# Patient Record
Sex: Male | Born: 1975 | Race: Black or African American | Hispanic: No | Marital: Single | Smoking: Never smoker
Health system: Southern US, Community
[De-identification: ages and names within clinical notes are randomized; demographics above are authoritative.]

## PROBLEM LIST (undated history)

## (undated) HISTORY — PX: HERNIA REPAIR: SHX51

---

## 2017-07-24 ENCOUNTER — Encounter (HOSPITAL_BASED_OUTPATIENT_CLINIC_OR_DEPARTMENT_OTHER): Payer: Self-pay | Admitting: *Deleted

## 2017-07-24 ENCOUNTER — Other Ambulatory Visit: Payer: Self-pay

## 2017-07-24 ENCOUNTER — Emergency Department (HOSPITAL_BASED_OUTPATIENT_CLINIC_OR_DEPARTMENT_OTHER): Payer: BLUE CROSS/BLUE SHIELD

## 2017-07-24 ENCOUNTER — Emergency Department (HOSPITAL_BASED_OUTPATIENT_CLINIC_OR_DEPARTMENT_OTHER)
Admission: EM | Admit: 2017-07-24 | Discharge: 2017-07-24 | Disposition: A | Discharge status: Home / Self Care | Payer: BLUE CROSS/BLUE SHIELD | Attending: Emergency Medicine | Admitting: Emergency Medicine

## 2017-07-24 DIAGNOSIS — R519 Headache, unspecified: Secondary | ICD-10-CM

## 2017-07-24 DIAGNOSIS — F439 Reaction to severe stress, unspecified: Secondary | ICD-10-CM

## 2017-07-24 DIAGNOSIS — F419 Anxiety disorder, unspecified: Secondary | ICD-10-CM | POA: Diagnosis not present

## 2017-07-24 DIAGNOSIS — R51 Headache: Secondary | ICD-10-CM | POA: Diagnosis present

## 2017-07-24 DIAGNOSIS — R52 Pain, unspecified: Secondary | ICD-10-CM

## 2017-07-24 DIAGNOSIS — R079 Chest pain, unspecified: Secondary | ICD-10-CM | POA: Diagnosis not present

## 2017-07-24 DIAGNOSIS — M791 Myalgia, unspecified site: Secondary | ICD-10-CM | POA: Diagnosis not present

## 2017-07-24 DIAGNOSIS — R748 Abnormal levels of other serum enzymes: Secondary | ICD-10-CM | POA: Diagnosis not present

## 2017-07-24 LAB — COMPREHENSIVE METABOLIC PANEL
ALK PHOS: 45 U/L (ref 38–126)
ALT: 27 U/L (ref 17–63)
AST: 40 U/L (ref 15–41)
Albumin: 4.5 g/dL (ref 3.5–5.0)
Anion gap: 9 (ref 5–15)
BUN: 16 mg/dL (ref 6–20)
CALCIUM: 9.5 mg/dL (ref 8.9–10.3)
CHLORIDE: 105 mmol/L (ref 101–111)
CO2: 23 mmol/L (ref 22–32)
CREATININE: 1.14 mg/dL (ref 0.61–1.24)
GFR calc Af Amer: >60 mL/min (ref >60)
GFR calc non Af Amer: >60 mL/min (ref >60)
Glucose, Bld: 81 mg/dL (ref 65–99)
Potassium: 3.5 mmol/L (ref 3.5–5.1)
SODIUM: 137 mmol/L (ref 135–145)
Total Bilirubin: 1.8 mg/dL — ABNORMAL HIGH (ref 0.3–1.2)
Total Protein: 7.9 g/dL (ref 6.5–8.1)

## 2017-07-24 LAB — URINALYSIS, MICROSCOPIC (REFLEX)

## 2017-07-24 LAB — TROPONIN I: Troponin I: <0.03 ng/mL (ref <0.03)

## 2017-07-24 LAB — URINALYSIS, ROUTINE W REFLEX MICROSCOPIC
Bilirubin Urine: NEGATIVE
GLUCOSE, UA: NEGATIVE mg/dL
Ketones, ur: 15 mg/dL — AB
LEUKOCYTES UA: NEGATIVE
Nitrite: NEGATIVE
PH: 6 (ref 5.0–8.0)
Protein, ur: NEGATIVE mg/dL

## 2017-07-24 LAB — CBC WITH DIFFERENTIAL/PLATELET
Basophils Absolute: 0 10*3/uL (ref 0.0–0.1)
Basophils Relative: 0 %
EOS ABS: 0 10*3/uL (ref 0.0–0.7)
EOS PCT: 1 %
HCT: 40.8 % (ref 39.0–52.0)
HEMOGLOBIN: 14.7 g/dL (ref 13.0–17.0)
LYMPHS ABS: 2 10*3/uL (ref 0.7–4.0)
LYMPHS PCT: 50 %
MCH: 29.3 pg (ref 26.0–34.0)
MCHC: 36 g/dL (ref 30.0–36.0)
MCV: 81.3 fL (ref 78.0–100.0)
Monocytes Absolute: 0.3 10*3/uL (ref 0.1–1.0)
Monocytes Relative: 8 %
NEUTROS PCT: 41 %
Neutro Abs: 1.7 10*3/uL (ref 1.7–7.7)
PLATELETS: 146 10*3/uL — AB (ref 150–400)
RBC: 5.02 MIL/uL (ref 4.22–5.81)
RDW: 13.1 % (ref 11.5–15.5)
WBC: 4 10*3/uL (ref 4.0–10.5)

## 2017-07-24 LAB — CK
CK TOTAL: 614 U/L — AB (ref 49–397)
Total CK: 648 U/L — ABNORMAL HIGH (ref 49–397)

## 2017-07-24 MED ORDER — SODIUM CHLORIDE 0.9 % IV BOLUS
1000.0000 mL | Freq: Once | INTRAVENOUS | Status: AC
  Administered 2017-07-24: 1000 mL via INTRAVENOUS

## 2017-07-24 MED ORDER — KETOROLAC TROMETHAMINE 30 MG/ML IJ SOLN
30.0000 mg | Freq: Once | INTRAMUSCULAR | Status: AC
Start: 2017-07-24 — End: 2017-07-24
  Administered 2017-07-24: 30 mg via INTRAVENOUS
  Filled 2017-07-24: qty 1

## 2017-07-24 MED ORDER — PROCHLORPERAZINE EDISYLATE 10 MG/2ML IJ SOLN
10.0000 mg | Freq: Once | INTRAMUSCULAR | Status: AC
  Administered 2017-07-24: 10 mg via INTRAVENOUS
  Filled 2017-07-24: qty 2

## 2017-07-24 MED ORDER — DIPHENHYDRAMINE HCL 50 MG/ML IJ SOLN
25.0000 mg | Freq: Once | INTRAMUSCULAR | Status: AC
Start: 2017-07-24 — End: 2017-07-24
  Administered 2017-07-24: 25 mg via INTRAVENOUS
  Filled 2017-07-24: qty 1

## 2017-07-24 NOTE — ED Notes (Signed)
Patient transported to X-ray 

## 2017-07-24 NOTE — ED Notes (Signed)
Updated pt to plan of care 

## 2017-07-24 NOTE — ED Provider Notes (Signed)
[facility] EMERGENCY DEPARTMENT Provider Note   CSN: [id number] Arrival date & time: 07/24/17  1405     History   Chief Complaint Chief Complaint  Patient presents with  . Headache    HPI Jonathan Galvan is a 42 y.o. male.  HPI  1.6631mo migraine Pressure in head, has had body aches Anxiety Today began to have chest pain Lights make headache worse.  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 Today has been the worse.  Nothing makes it worse, happens spontaneously. Not exertional.  No n/v/dyspnea.  No fevers or cough, no leg pain or swelling.   Does not have PCP Saw urologist 2-3 years ago, difficulty urinating  No smoking, no drinking, no drugs Not known if family hx of CAD  History reviewed. No pertinent past medical history.  There are no active problems to display for this patient.   Past Surgical History:  Procedure Laterality Date  . HERNIA REPAIR          Home Medications    Prior to Admission medications   Not on File    Family History History reviewed. No pertinent family history.  Social History Social History   Tobacco Use  . Smoking status: Never Smoker  . Smokeless tobacco: Never Used  Substance Use Topics  . Alcohol use: Never    Frequency: Never  . Drug use: Never     Allergies   Quinine derivatives   Review of Systems Review of Systems  Constitutional: Positive for  fatigue. Negative for fever.  HENT: Negative for congestion and sore throat.   Eyes: Positive for photophobia. Negative for visual disturbance.  Respiratory: Negative for cough and shortness of breath.   Cardiovascular: Positive for chest pain. Negative for leg swelling.  Gastrointestinal: Negative for abdominal pain, constipation, diarrhea, nausea and vomiting.  Genitourinary: Negative for difficulty urinating.  Musculoskeletal: Positive for arthralgias and myalgias. Negative for back pain and neck stiffness.  Skin: Negative for rash.  Neurological: Positive for headaches. Negative for syncope, facial asymmetry, speech difficulty, weakness and numbness.  Psychiatric/Behavioral: Positive for sleep disturbance. Negative for suicidal ideas. The patient is nervous/anxious.      Physical Exam Updated Vital Signs BP 124/83   Pulse 76   Temp 98.5 F (36.9 C)   Resp 14   Ht 5\' 7"  (1.702 m)   Wt 77.1 kg (170 lb)   SpO2 100%   BMI 26.63 kg/m   Physical Exam  Constitutional: He is oriented to person, place, and time. He appears well-developed and well-nourished. No distress.  HENT:  Head: Normocephalic and atraumatic.  Eyes: Conjunctivae and EOM are normal.  Neck: Normal range of motion.  Cardiovascular: Normal rate, regular rhythm, normal heart sounds and intact distal pulses. Exam reveals no gallop and no friction rub.  No murmur heard. Pulmonary/Chest: Effort normal and breath sounds normal. No respiratory distress. He has no wheezes. He has no rales.  Abdominal: Soft. He  exhibits no distension. There is no tenderness. There is no guarding.  Musculoskeletal: He exhibits no edema.  Neurological: He is alert and oriented to person, place, and time. He has normal strength. No cranial nerve deficit or sensory deficit. Coordination normal. GCS eye subscore is 4. GCS verbal subscore is 5. GCS motor subscore is 6.  Skin: Skin is warm and dry. He is not diaphoretic.  Nursing note and vitals  reviewed.    ED Treatments / Results  Labs (all labs ordered are listed, but only abnormal results are displayed) Labs Reviewed  CBC WITH DIFFERENTIAL/PLATELET - Abnormal; Notable for the following components:      Result Value   Platelets 146 (*)    All other components within normal limits  COMPREHENSIVE METABOLIC PANEL - Abnormal; Notable for the following components:   Total Bilirubin 1.8 (*)    All other components within normal limits  CK - Abnormal; Notable for the following components:   Total CK 648 (*)    All other components within normal limits  URINALYSIS, ROUTINE W REFLEX MICROSCOPIC - Abnormal; Notable for the following components:   Specific Gravity, Urine >1.030 (*)    Hgb urine dipstick SMALL (*)    Ketones, ur 15 (*)    All other components within normal limits  URINALYSIS, MICROSCOPIC (REFLEX) - Abnormal; Notable for the following components:   Bacteria, UA RARE (*)    All other components within normal limits  CK - Abnormal; Notable for the following components:   Total CK 614 (*)    All other components within normal limits  TROPONIN I  TROPONIN I    EKG EKG Interpretation  Date/Time:  Wednesday July 24 2017 18:11:37 EDT Ventricular Rate:  67 PR Interval:    QRS Duration: 94 QT Interval:  393 QTC Calculation: 415 R Axis:   87 Text Interpretation:  Sinus rhythm Nonspecific T abnormalities, anterior leads No significant change since last tracing Confirmed by Alvira Monday ([id number]) on 07/24/2017 7:27:51 PM Also confirmed by Alvira Monday ([id number]), editor Barbette Hair [phone])  on 07/25/2017 7:18:49 AM   Radiology Dg Chest 2 View  Result Date: 07/24/2017 CLINICAL DATA:  Initial evaluation for acute chest pain, migraine. EXAM: CHEST - 2 VIEW COMPARISON:  None. FINDINGS: Cardiac and mediastinal silhouettes within normal limits. Lungs normally inflated. No focal infiltrates. No pulmonary edema or pleural effusion. No pneumothorax. No acute osseus  abnormality. IMPRESSION: No active cardiopulmonary disease. Electronically Signed   By: Rise Mu M.D.   On: 07/24/2017 16:31    Procedures Procedures (including critical care time)  Medications Ordered in ED Medications  sodium chloride 0.9 % bolus 1,000 mL (0 mLs Intravenous Stopped 07/24/17 1635)  prochlorperazine (COMPAZINE) injection 10 mg (10 mg Intravenous Given 07/24/17 1546)  diphenhydrAMINE (BENADRYL) injection 25 mg (25 mg Intravenous Given 07/24/17 1546)  sodium chloride 0.9 % bolus 1,000 mL (0 mLs Intravenous Stopped 07/24/17 1719)  ketorolac (TORADOL) 30 MG/ML injection 30 mg (30 mg Intravenous Given 07/24/17 1809)     Initial Impression / Assessment and Plan / ED Course  I have reviewed the triage vital signs and the nursing notes.  Pertinent labs & imaging results that were available during my care of the patient were reviewed by me and considered in my medical decision making (see chart for details).    42 year old male with no significant medical history presents with concern for headache for 1.5 months, and body aches.  Headache began slowly, no fevers, no trauma, have low  suspicion for subarachnoid hemorrhage, meningitis, or intracranial hemorrhage.  Given duration of headache, discussed possibility of ordering CT in the emergency department, however do feel outpatient follow-up is appropriate, and patient prefers this.  Patient reports chest pain which has been going on for the last 3 days.  EKG was evaluated by me and shows anterior T wave changes without prior for comparison. Not classic Wellens pattern. Troponin is negative.  Headache, chest pain and body aches are in the setting of significant stress at work, and patient reports severe anxiety due to increased workload and feeling of being overwhelmed.  Denies SI.  His CK is mildly elevated at 648.  He has normal renal function.  He is not on any medications to cause this.    He was given IV fluids, headache  cocktail in the ED with improvement.  Discussed chest pain and elevated CK with patient.  Delta troponins negative and repeat ECG unchanged.   He has no cardiac risk factors and his chest pain is atypical in nature, sharp, spontaneous and nonexertional and in the presence of several other symptoms such as headache and feel he is stable for outpatient follow up with Cardiology.  His CK is also elevated, however do not feel he requires admission for the mild elevation he has. Recommend hydration, rest and continued PCP evaluation for myalgias with CK elevation.  Patient discharged in stable condition with understanding of reasons to return.     Final Clinical Impressions(s) / ED Diagnoses   Final diagnoses:  Elevated CK  Acute nonintractable headache, unspecified headache type  Body aches  Stress  Anxiety  Chest pain, unspecified type    ED Discharge Orders    None       Alvira Monday, MD 07/25/17 1158

## 2017-07-24 NOTE — ED Notes (Signed)
Pt's urine is a dark amber, states that it got that dark several months ago, and that he does not drink enough water.

## 2017-07-24 NOTE — ED Notes (Signed)
Pt returned from xray, is asleep

## 2017-07-24 NOTE — ED Notes (Signed)
Pt verbalizes understanding of d/c instructions and denies any further needs at this time. 

## 2017-07-24 NOTE — ED Notes (Signed)
Pt c/o headache for the last two months.  He went to see a holistic provider and took some "herbs" for his headache, but they did not help.  Pt has not tried any OTC meds in the last several weeks.

## 2017-07-24 NOTE — ED Triage Notes (Addendum)
Pt c/o h/a and generalized body aches  x 1.5 months

## 2017-07-31 ENCOUNTER — Ambulatory Visit: Payer: BLUE CROSS/BLUE SHIELD | Admitting: Cardiology

## 2017-08-05 ENCOUNTER — Ambulatory Visit: Payer: BLUE CROSS/BLUE SHIELD | Admitting: Cardiology

## 2019-03-24 IMAGING — CR DG CHEST 2V
2 series · 2 of 2 positions shown · non-contrast
Comparison: None.

CLINICAL DATA: Initial evaluation for acute chest pain, migraine.

EXAM:
CHEST - 2 VIEW

[w chest pa]
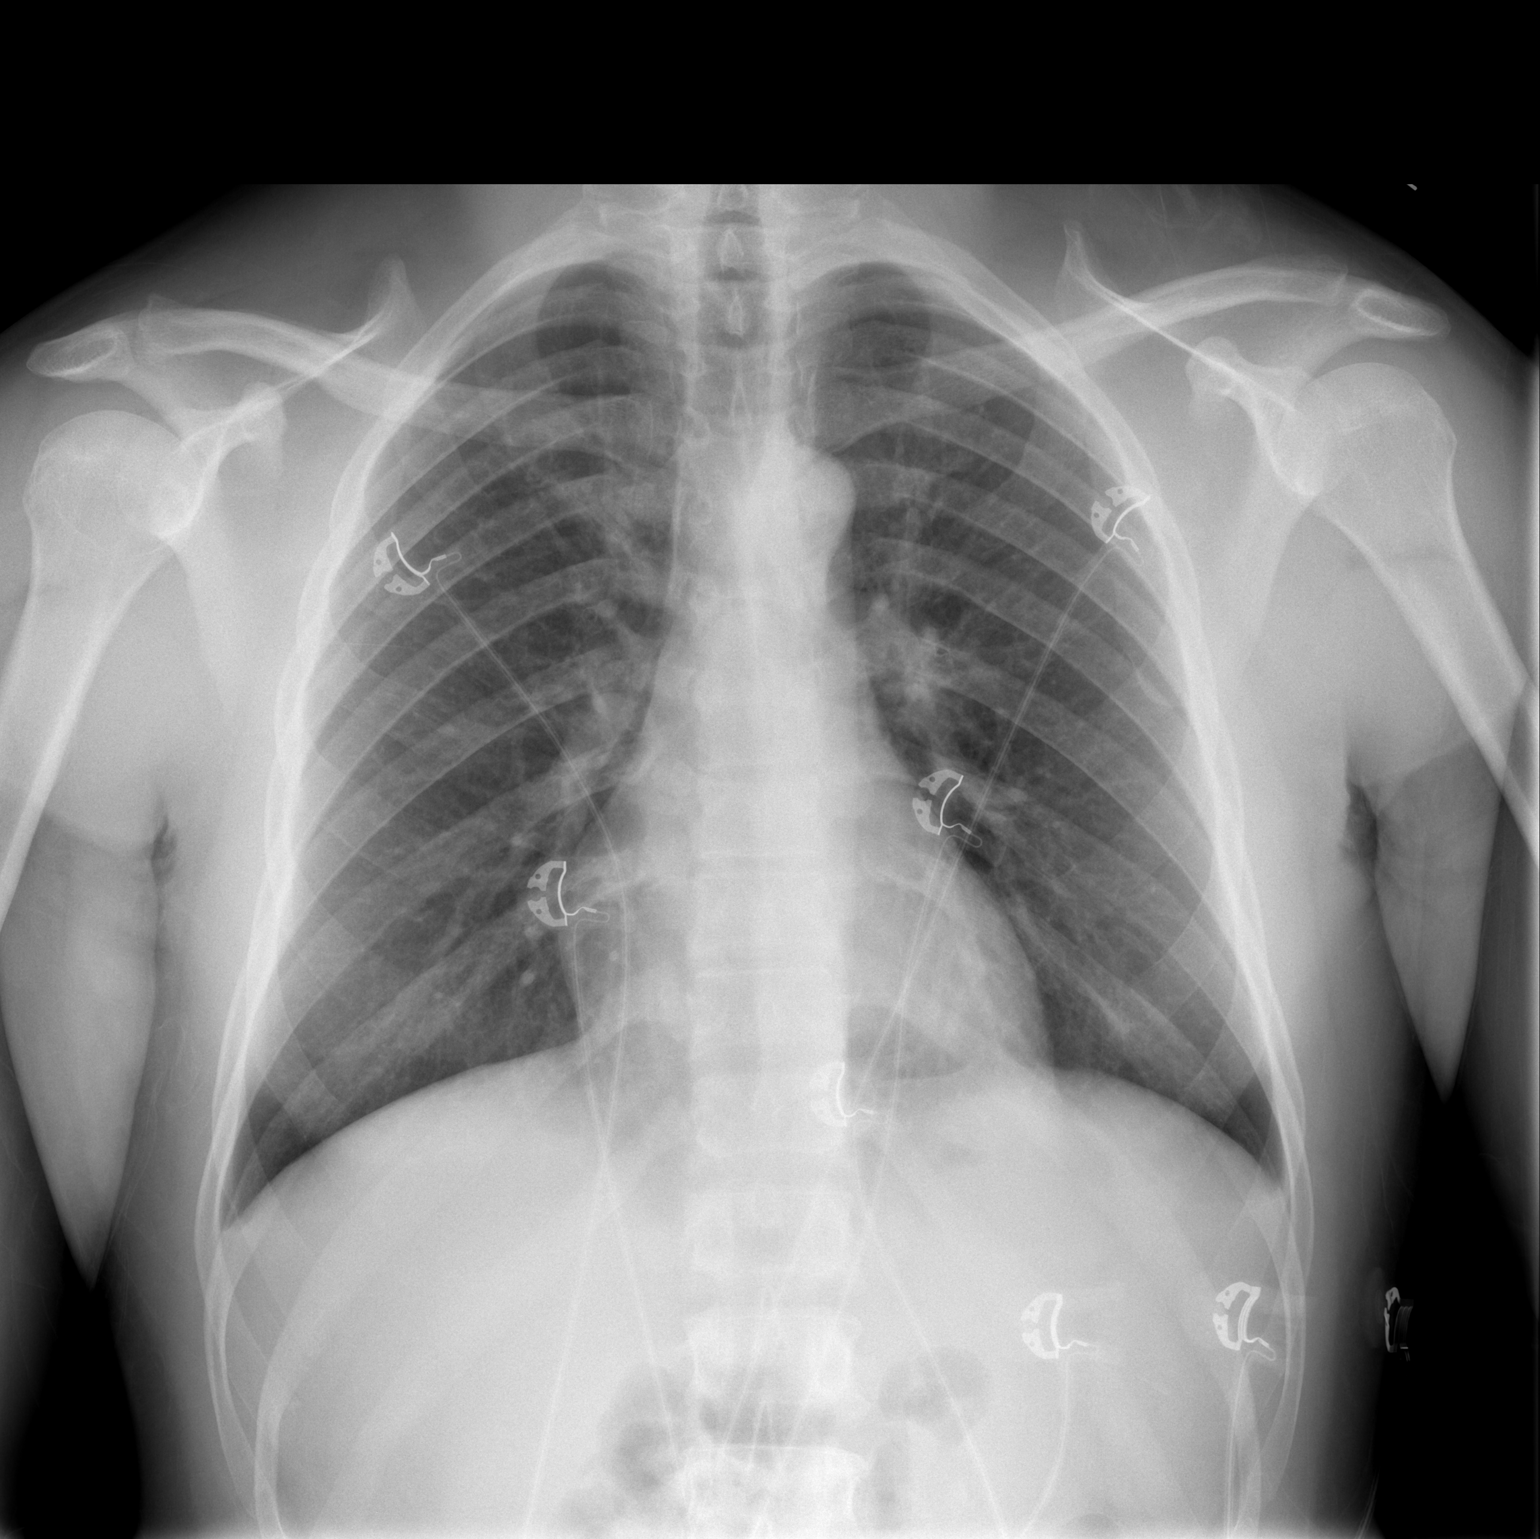

[w chest lat]
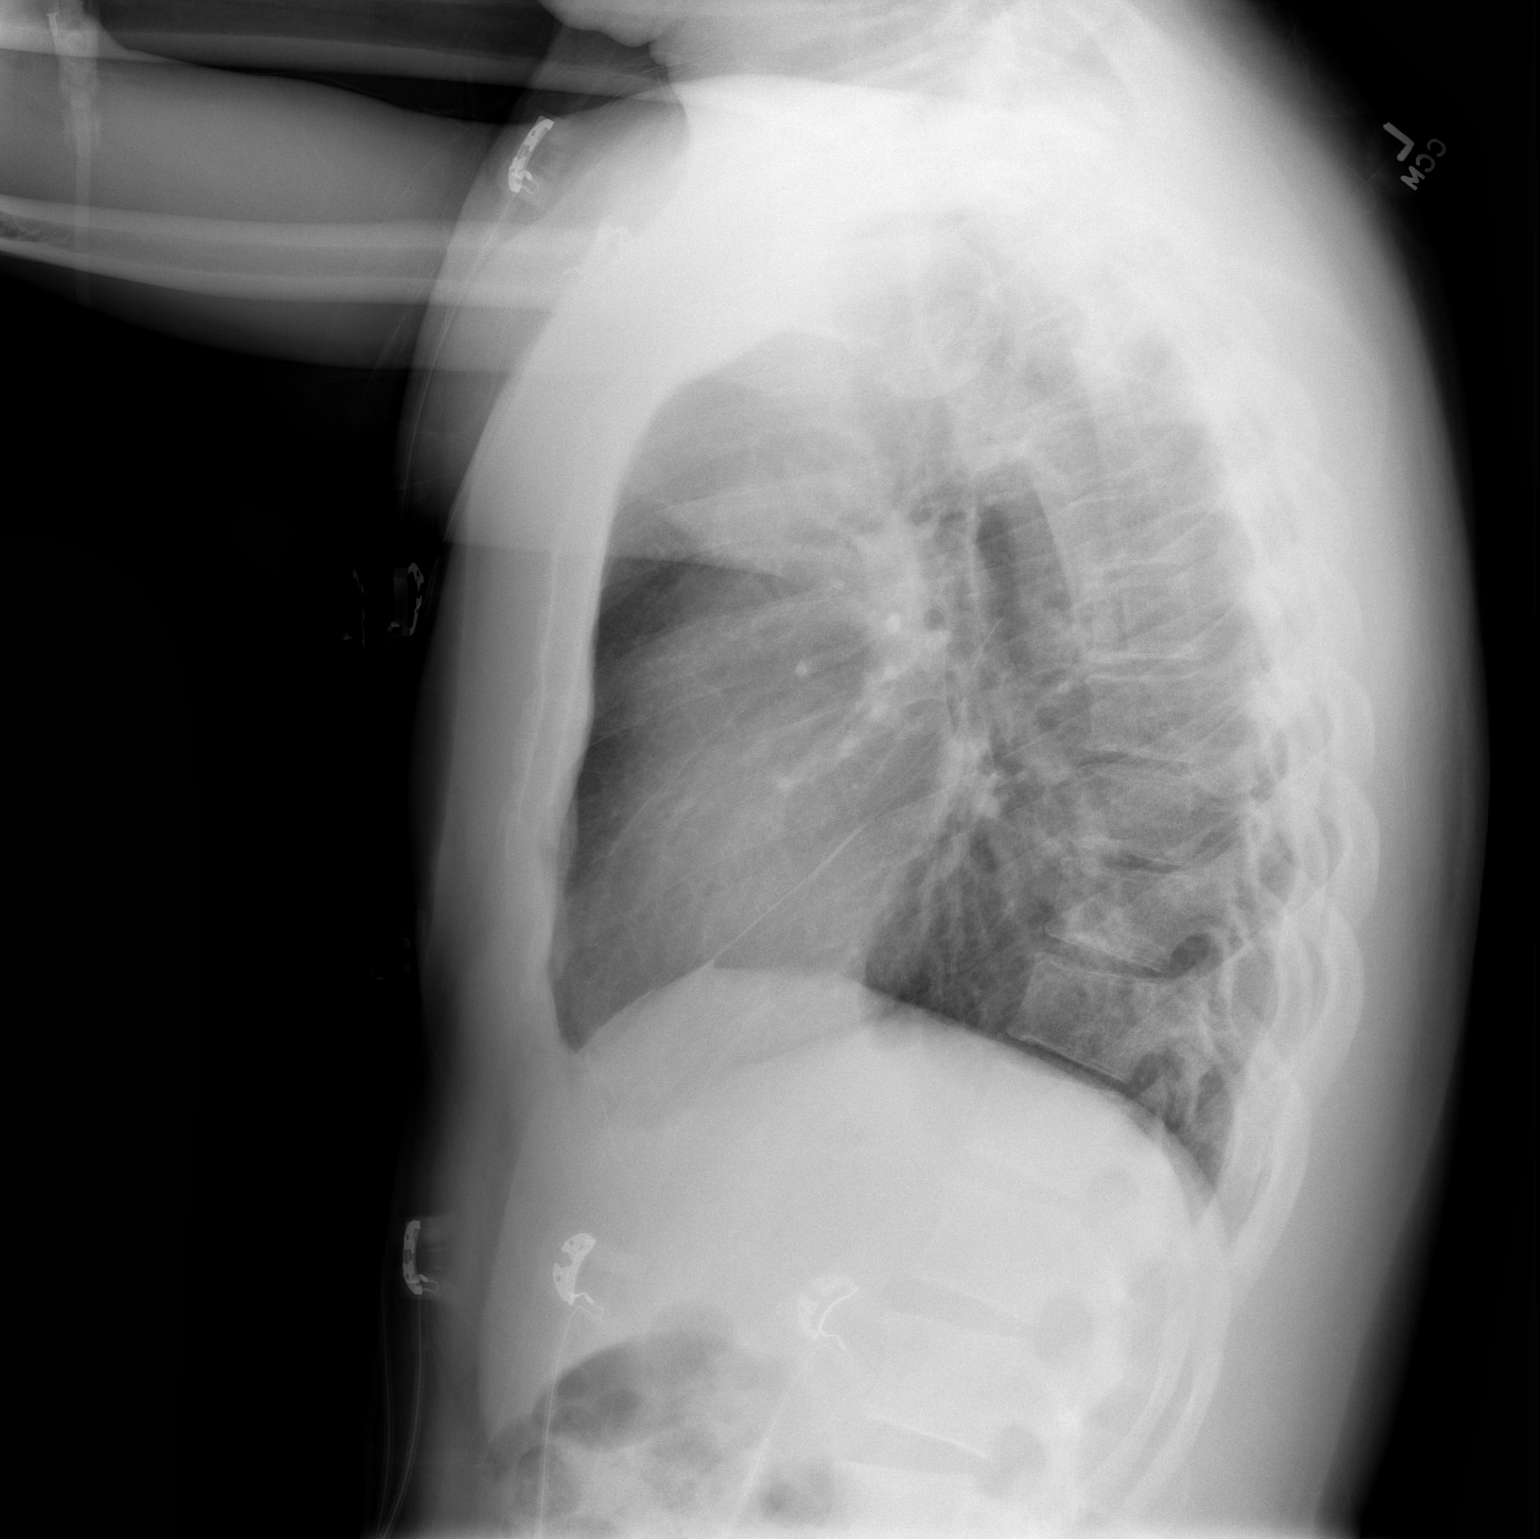

[2 of 2 positions shown; findings below may reference images not displayed]

FINDINGS: Cardiac and mediastinal silhouettes within normal limits.

Lungs normally inflated. No focal infiltrates. No pulmonary edema or
pleural effusion. No pneumothorax.

No acute osseus abnormality.
IMPRESSION: No active cardiopulmonary disease.
